# Patient Record
Sex: Female | Born: 1995 | Race: White | Hispanic: No | Marital: Married | Smoking: Never smoker
Health system: Southern US, Community
[De-identification: ages and names within clinical notes are randomized; demographics above are authoritative.]

## PROBLEM LIST (undated history)

## (undated) DIAGNOSIS — R55 Syncope and collapse: Principal | ICD-10-CM

## (undated) DIAGNOSIS — K859 Acute pancreatitis without necrosis or infection, unspecified: Secondary | ICD-10-CM

## (undated) DIAGNOSIS — D219 Benign neoplasm of connective and other soft tissue, unspecified: Secondary | ICD-10-CM

## (undated) DIAGNOSIS — Q5128 Other doubling of uterus, other specified: Secondary | ICD-10-CM

## (undated) DIAGNOSIS — E282 Polycystic ovarian syndrome: Secondary | ICD-10-CM

## (undated) DIAGNOSIS — R51 Headache: Secondary | ICD-10-CM

## (undated) HISTORY — DX: Headache: R51

## (undated) HISTORY — PX: WISDOM TOOTH EXTRACTION: SHX21

## (undated) HISTORY — DX: Polycystic ovarian syndrome: E28.2

## (undated) HISTORY — DX: Syncope and collapse: R55

## (undated) HISTORY — DX: Acute pancreatitis without necrosis or infection, unspecified: K85.90

## (undated) HISTORY — DX: Other and unspecified doubling of uterus: Q51.28

## (undated) HISTORY — DX: Benign neoplasm of connective and other soft tissue, unspecified: D21.9

---
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
## 2015-10-11 ENCOUNTER — Ambulatory Visit (INDEPENDENT_AMBULATORY_CARE_PROVIDER_SITE_OTHER): Payer: BLUE CROSS/BLUE SHIELD | Admitting: Internal Medicine

## 2015-10-11 ENCOUNTER — Telehealth: Payer: Self-pay | Admitting: Internal Medicine

## 2015-10-11 ENCOUNTER — Encounter: Payer: Self-pay | Admitting: Internal Medicine

## 2015-10-11 VITALS — BP 98/60 | HR 72 | Temp 98.0°F | Wt 131.8 lb

## 2015-10-11 DIAGNOSIS — R509 Fever, unspecified: Secondary | ICD-10-CM

## 2015-10-11 DIAGNOSIS — R109 Unspecified abdominal pain: Secondary | ICD-10-CM

## 2015-10-11 DIAGNOSIS — R197 Diarrhea, unspecified: Secondary | ICD-10-CM

## 2015-10-11 DIAGNOSIS — R112 Nausea with vomiting, unspecified: Secondary | ICD-10-CM | POA: Diagnosis not present

## 2015-10-11 LAB — CBC
HCT: 42.4 % (ref 36.0–46.0)
HEMOGLOBIN: 14.4 g/dL (ref 12.0–15.0)
MCHC: 34 g/dL (ref 30.0–36.0)
MCV: 80.8 fl (ref 78.0–100.0)
PLATELETS: 215 10*3/uL (ref 150.0–400.0)
RBC: 5.24 Mil/uL — ABNORMAL HIGH (ref 3.87–5.11)
RDW: 14.4 % (ref 11.5–14.6)
WBC: 6.7 10*3/uL (ref 4.5–10.5)

## 2015-10-11 LAB — COMPREHENSIVE METABOLIC PANEL
ALK PHOS: 67 U/L (ref 39–117)
ALT: 17 U/L (ref 0–35)
AST: 19 U/L (ref 0–37)
Albumin: 4.5 g/dL (ref 3.5–5.2)
BILIRUBIN TOTAL: 0.7 mg/dL (ref 0.2–1.2)
BUN: 28 mg/dL — ABNORMAL HIGH (ref 6–23)
CO2: 24 meq/L (ref 19–32)
CREATININE: 1.42 mg/dL — AB (ref 0.40–1.20)
Calcium: 10.2 mg/dL (ref 8.4–10.5)
Chloride: 99 mEq/L (ref 96–112)
GFR: 50.01 mL/min — AB (ref >60.00)
GLUCOSE: 88 mg/dL (ref 70–99)
Potassium: 3.3 mEq/L — ABNORMAL LOW (ref 3.5–5.1)
Sodium: 134 mEq/L — ABNORMAL LOW (ref 135–145)
TOTAL PROTEIN: 8.5 g/dL — AB (ref 6.0–8.3)

## 2015-10-11 MED ORDER — ONDANSETRON HCL 4 MG/2ML IJ SOLN
4.0000 mg | Freq: Once | INTRAMUSCULAR | Status: AC
  Administered 2015-10-11: 4 mg via INTRAMUSCULAR

## 2015-10-11 MED ORDER — ONDANSETRON HCL 4 MG PO TABS: 4.0000 mg | ORAL_TABLET | Freq: Three times a day (TID) | ORAL | 0 refills | Status: DC | PRN

## 2015-10-11 NOTE — Progress Notes (Signed)
Subjective:    Patient ID: Alicia Davis, female    DOB: Jun 10, 1995, 20 y.o.   MRN: [id number]  HPI  Pt presents to the clinic today with a complaint of diarrhea, vomiting, and lower abdominal pain x 3 days.  She reports the diarrhea is watery, and she is having up to 6 bowel movements a day.  She denies bloody stools or mucous in the stools.  She states that she is unable to keep any food down, and even drinking too much water at a time will cause emesis.  She has tried a bland diet for the past few days with no improvement.  She describes the abdominal pain as constant cramping with intermittent sharp pains at a severity of 9/10.  She reports that when she has the sharp abdominal pains she will also have pain on the right side of her head that she states is "like a lightening strike."  She denies photophobia or phonophobia.  She reports fatigue and intermittent dizziness with associated blurred vision.  She admits to fever ranging from 100-103 and has tried Tylenol with some improvement but symptoms return.  She denies urinary urgency, frequency, changes in urine color, or pain with urination.  She has tried [employer] with no relief.  She denies recent sick contacts or new foods, and reports no other family members have these symptoms.  Her LMP was 09/23/15, and she reports irregular periods x5 years.  She states the current symptoms feel different than the cramping she will have with periods, and denies spotting.    Review of Systems   Past Medical History:  Diagnosis Date  . Headache(784.0)   . Syncope 11/2011   neurocardiogenic per cards Aida Puffer at [facility])    Current Outpatient Prescriptions  Medication Sig Dispense Refill  . ondansetron (ZOFRAN) 4 MG tablet Take 1 tablet (4 mg total) by mouth every 8 (eight) hours as needed. 20 tablet 0   No current facility-administered medications for this visit.     No Known Allergies  Family History  Problem Relation Age of Onset  . Coronary  artery disease Maternal Grandmother 58    MI  . Diabetes Maternal Grandmother   . Coronary artery disease Maternal Grandfather 74    CABG  . Stroke Paternal Grandfather   . Cancer Other 41    prostate (great grandfather)  . Hypertension Neg Hx     Social History   Social History  . Marital status: Single    Spouse name: N/A  . Number of children: N/A  . Years of education: N/A   Occupational History  . Not on file.   Social History Main Topics  . Smoking status: Never Smoker  . Smokeless tobacco: Never Used  . Alcohol use No  . Drug use: No  . Sexual activity: Not on file   Other Topics Concern  . 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   Activity: works on farm   Diet: god water, fruits/vegetables daily     Const: Pt reports fever, fatigue, and dizziness.   HEENT: Pt reports blurred vision when dizzy. GI: Pt reports vomiting, diarrhea, and abdominal pain. GU: Denies urinary frequency or urgency, changes in urine color, pain with urination, or vaginal spotting.  No other specific complaints in a complete review of systems (except as listed in HPI  above).       Objective:   Physical Exam  BP 98/60   Pulse 72   Temp 98 F (36.7 C) (Oral)   Wt 131 lb 12 oz (59.8 kg)   LMP 09/23/2015 (Exact Date)   SpO2 98%   BMI 24.10 kg/m   General: Ill-appearing, in no acute distress. Neck: No lymphadenopathy noted. Pulm: Clear to auscultation bilaterally.  No wheezes, rales, or rhonchi. CV: Regular rate and rhythm.  No murmurs, rubs, or gallops. GI: Positive bowel sounds all four quadrants.  Tympanic on percussion.  Soft, tender to palpation RUQ and RLQ.  Positive Obturator sign.  Positive Psoas sign.  Negative Rovsing's sign.  Negative rebound tenderness.      Assessment & Plan:   Diarrhea, emesis, abdominal pain, fever:  ?Viral  gastrointestinal infection vs Appendicitis/Colitis Stat CBC, CMP Continue bland diet, increase fluid intake Zofran 4 mg IM today Zofran 4mg  q8hrs as needed for nausea  Will follow-up based on lab results, may need CT of abdomen BAITY, REGINA, NP

## 2015-10-11 NOTE — Telephone Encounter (Signed)
She needs the Zofran if she is not able to eat or drink or nausea is too bad.

## 2015-10-11 NOTE — Patient Instructions (Signed)

## 2015-10-11 NOTE — Telephone Encounter (Signed)
Called pt but no VM set up--will try again later

## 2015-10-11 NOTE — Telephone Encounter (Signed)
Patient called to get her lab results.  Threasa Beards was unavailable.  Patient said she was 40 minutes away and needed to know her results in case she needed to be seen.  I gave patient her lab results. Patient said she hasn't thrown up since she left the office this morning.  Patient drank water and had a couple bites of grilled chicken and she has been able to keep that down.  Patient wants to know if she needs medication.  Patient said if she can't keep the water down, she'll go to urgent care or the er. Please call patient back about medication.

## 2015-10-12 NOTE — Telephone Encounter (Signed)
Patient returned Alicia Davis's call.  Please call patient back at [phone].

## 2016-04-02 ENCOUNTER — Ambulatory Visit (INDEPENDENT_AMBULATORY_CARE_PROVIDER_SITE_OTHER): Payer: BLUE CROSS/BLUE SHIELD | Admitting: Internal Medicine

## 2016-04-02 ENCOUNTER — Encounter (INDEPENDENT_AMBULATORY_CARE_PROVIDER_SITE_OTHER): Payer: Self-pay

## 2016-04-02 ENCOUNTER — Ambulatory Visit: Payer: BLUE CROSS/BLUE SHIELD | Admitting: Internal Medicine

## 2016-04-02 ENCOUNTER — Encounter: Payer: Self-pay | Admitting: Internal Medicine

## 2016-04-02 VITALS — BP 104/68 | HR 75 | Temp 97.7°F | Wt 137.0 lb

## 2016-04-02 DIAGNOSIS — N926 Irregular menstruation, unspecified: Secondary | ICD-10-CM

## 2016-04-02 MED ORDER — NORGESTIM-ETH ESTRAD TRIPHASIC 0.18/0.215/0.25 MG-35 MCG PO TABS: 1.0000 | ORAL_TABLET | Freq: Every day | ORAL | 11 refills | Status: DC

## 2016-04-02 NOTE — Patient Instructions (Signed)
Menorrhagia Menorrhagia is when your menstrual periods are heavy or last longer than usual. Follow these instructions at home:  Only take medicine as told by your doctor.  Take any iron pills as told by your doctor. Heavy bleeding may cause low levels of iron in your body.  Do not take aspirin 1 week before or during your period. Aspirin can make the bleeding worse.  Lie down for a while if you change your tampon or pad more than once in 2 hours. This may help lessen the bleeding.  Eat a healthy diet and foods with iron. These foods include leafy green vegetables, meat, liver, eggs, and whole grain breads and cereals.  Do not try to lose weight. Wait until the heavy bleeding has stopped and your iron level is normal. Contact a doctor if:  You soak through a pad or tampon every 1 or 2 hours, and this happens every time you have a period.  You need to use pads and tampons at the same time because you are bleeding so much.  You need to change your pad or tampon during the night.  You have a period that lasts for more than 8 days.  You pass clots bigger than 1 inch (2.5 cm) wide.  You have irregular periods that happen more or less often than once a month.  You feel dizzy or pass out (faint).  You feel very weak or tired.  You feel short of breath or feel your heart is beating too fast when you exercise.  You feel sick to your stomach (nausea) and you throw up (vomit) while you are taking your medicine.  You have watery poop (diarrhea) while you are taking your medicine.  You have any problems that may be related to the medicine you are taking. Get help right away if:  You soak through 4 or more pads or tampons in 2 hours.  You have any bleeding while you are pregnant. This information is not intended to replace advice given to you by your health care provider. Make sure you discuss any questions you have with your health care provider. Document Released: 12/04/2007 Document  Revised: 08/02/2015 Document Reviewed: 08/26/2012 Elsevier Interactive Patient Education  2017 Elsevier Inc.  

## 2016-04-02 NOTE — Progress Notes (Signed)
Subjective:    Patient ID: Alicia Davis, female    DOB: [birth year], 21 y.o.   MRN: [id number]  HPI  Pt presents to the clinic today with c/o irregular menstrual cycle. She reports this started 5 years ago. Her menstrual will last 4-14 days when it does come on. It can occur 2 x a month or she can not have a period for 4 months at a time. She denies heavy bleeding, clots or cramping. She is sexually active. She has taken multiple pregnancy test which are negative. She reports she has a family history of PCOS.  Review of Systems      Past Medical History:  Diagnosis Date  . Headache(784.0)   . Syncope 11/2011   neurocardiogenic per cards Aida Puffer at [facility])    Current Outpatient Prescriptions  Medication Sig Dispense Refill  . Norgestimate-Ethinyl Estradiol Triphasic 0.18/0.215/0.25 MG-35 MCG tablet Take 1 tablet by mouth daily. 1 Package 11   No current facility-administered medications for this visit.     No Known Allergies  Family History  Problem Relation Age of Onset  . Coronary artery disease Maternal Grandmother 58    MI  . Diabetes Maternal Grandmother   . Coronary artery disease Maternal Grandfather 74    CABG  . Stroke Paternal Grandfather   . Cancer Other 68    prostate (great grandfather)  . Hypertension Neg Hx     Social History   Social History  . Marital status: Single    Spouse name: N/A  . Number of children: N/A  . Years of education: N/A   Occupational History  . Not on file.   Social History Main Topics  . Smoking status: Never Smoker  . Smokeless tobacco: Never Used  . Alcohol use No  . Drug use: No  . Sexual activity: Not on file   Other Topics Concern  . 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   Activity: works on farm   Diet: god water, fruits/vegetables daily     Constitutional: Denies fever, malaise,  fatigue, headache or abrupt weight changes.  Gastrointestinal: Denies abdominal pain, bloating, constipation, diarrhea or blood in the stool.  GU: Pt reports irregular periods. Denies urgency, frequency, pain with urination, burning sensation, blood in urine, odor or discharge.  No other specific complaints in a complete review of systems (except as listed in HPI above).  Objective:   Physical Exam   BP 104/68   Pulse 75   Temp 97.7 F (36.5 C) (Oral)   Wt 137 lb (62.1 kg)   LMP 01/01/2016   SpO2 99%   BMI 25.06 kg/m  Wt Readings from Last 3 Encounters:  04/02/16 137 lb (62.1 kg)  10/11/15 131 lb 12 oz (59.8 kg)  11/27/14 141 lb 12 oz (64.3 kg) (73 %, Z= 0.60)*   * Growth percentiles are based on CDC 2-20 Years data.    General: Appears her stated age, well developed, well nourished in NAD. Abdomen: Soft and nontender. Normal bowel sounds.    BMET    Component Value Date/Time   NA 134 (L) 10/11/2015 1126   K 3.3 (L) 10/11/2015 1126   CL 99 10/11/2015 1126   CO2 24 10/11/2015 1126   GLUCOSE 88 10/11/2015 1126   BUN 28 (H) 10/11/2015 1126   CREATININE 1.42 (H) 10/11/2015 1126  CALCIUM 10.2 10/11/2015 1126    Lipid Panel  No results found for: CHOL, TRIG, HDL, CHOLHDL, VLDL, LDLCALC  CBC    Component Value Date/Time   WBC 6.7 10/11/2015 1126   RBC 5.24 (H) 10/11/2015 1126   HGB 14.4 10/11/2015 1126   HCT 42.4 10/11/2015 1126   PLT 215.0 10/11/2015 1126   MCV 80.8 10/11/2015 1126   MCHC 34.0 10/11/2015 1126   RDW 14.4 10/11/2015 1126   LYMPHSABS 1.3 10/29/2011 0946   MONOABS 0.3 10/29/2011 0946   EOSABS 0.1 10/29/2011 0946   BASOSABS 0.0 10/29/2011 0946    Hgb A1C No results found for: HGBA1C         Assessment & Plan:   Irregular menses:  US pelvis/transvaginal ordered- see Rosaria Ferries on the way out to schedule Will start Tri-Sprintec, start on Monday Discussed pregnancy possibility in the first 2 weeks and while on abx  Will follow up after  labs Webb Silversmith, NP

## 2016-04-04 ENCOUNTER — Ambulatory Visit
Admission: RE | Admit: 2016-04-04 | Discharge: 2016-04-04 | Disposition: A | Discharge status: Home / Self Care | Payer: BLUE CROSS/BLUE SHIELD | Source: Ambulatory Visit | Attending: Internal Medicine | Admitting: Internal Medicine

## 2016-04-04 DIAGNOSIS — N926 Irregular menstruation, unspecified: Secondary | ICD-10-CM | POA: Insufficient documentation

## 2017-02-03 ENCOUNTER — Ambulatory Visit (INDEPENDENT_AMBULATORY_CARE_PROVIDER_SITE_OTHER): Payer: BLUE CROSS/BLUE SHIELD | Admitting: Internal Medicine

## 2017-02-03 ENCOUNTER — Encounter: Payer: Self-pay | Admitting: Internal Medicine

## 2017-02-03 VITALS — BP 104/68 | HR 69 | Temp 98.0°F | Wt 143.0 lb

## 2017-02-03 DIAGNOSIS — J302 Other seasonal allergic rhinitis: Secondary | ICD-10-CM

## 2017-02-03 NOTE — Progress Notes (Signed)
HPI  Pt presents to the clinic today with c/o headache and nasal congestion. This started 1 week ago. The headache is located above her right eye. She describes the headache as pressure. She denies visual changes or dizziness. She is blowing clear mucous out of her nose. She feels like she is running a fever, because she has had chills, but she denies body aches. She has tried Benadryl, Tylenol, Dayquil and Nyquil with good relief. She has not had sick contacts that she is aware of.  Review of Systems     Past Medical History:  Diagnosis Date  . Headache(784.0)   . Syncope 11/2011   neurocardiogenic per cards Aida Puffer at [facility])    Family History  Problem Relation Age of Onset  . Coronary artery disease Maternal Grandmother 58       MI  . Diabetes Maternal Grandmother   . Coronary artery disease Maternal Grandfather 74       CABG  . Stroke Paternal Grandfather   . Cancer Other 49       prostate (great grandfather)  . Hypertension Neg Hx     Social History   Socioeconomic History  . Marital status: Single    Spouse name: Not on file  . Number of children: Not on file  . Years of education: Not on file  . Highest education level: Not on file  Social Needs  . Financial resource strain: Not on file  . Food insecurity - worry: Not on file  . Food insecurity - inability: Not on file  . Transportation needs - medical: Not on file  . Transportation needs - non-medical: Not on file  Occupational History  . Not on file  Tobacco Use  . Smoking status: Never Smoker  . Smokeless tobacco: Never Used  Substance and Sexual Activity  . Alcohol use: No  . Drug use: No  . Sexual activity: Not on file  Other Topics Concern  . 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   Activity: works on farm   Diet: god water, fruits/vegetables daily    No Known  Allergies   Constitutional: Positive headache. Denies fatigue, fever or abrupt weight changes.  HEENT:  Positive nasal congestion. Denies eye redness, ear pain, ringing in the ears, wax buildup, runny nose or sore throat. Respiratory:  Denies cough, difficulty breathing or shortness of breath.  Cardiovascular: Denies chest pain, chest tightness, palpitations or swelling in the hands or feet.   No other specific complaints in a complete review of systems (except as listed in HPI above).  Objective:   BP 104/68   Pulse 69   Temp 98 F (36.7 C) (Oral)   Wt 143 lb (64.9 kg)   LMP 01/12/2017   SpO2 99%   BMI 26.16 kg/m   General: Appears her stated age, in NAD. HEENT: Head: normal shape and size, no sinus tenderness noted; Ears: Tm's gray and intact, normal light reflex; Nose: mucosa boggy and moist, turbinates swollen; Throat/Mouth: + PND. Teeth present, mucosa erythematous and moist, no exudate noted, no lesions or ulcerations noted.  Neck:  Anterior cervical adenopathy noted on the right.  Pulmonary/Chest: Normal effort and positive vesicular breath sounds. No respiratory distress. No wheezes, rales or ronchi noted.       Assessment & Plan:   Allergic Rhintis  Can use a Neti  Pot which can be purchased from your local drug store. Flonase 2 sprays each nostril for 3 days and then as needed. Continue Dayquil and Nyquil as needed  RTC as needed or if symptoms persist. Webb Silversmith, NP

## 2017-02-03 NOTE — Patient Instructions (Signed)
Allergic Rhinitis Allergic rhinitis is when the mucous membranes in the nose respond to allergens. Allergens are particles in the air that cause your body to have an allergic reaction. This causes you to release allergic antibodies. Through a chain of events, these eventually cause you to release histamine into the blood stream. Although meant to protect the body, it is this release of histamine that causes your discomfort, such as frequent sneezing, congestion, and an itchy, runny nose. What are the causes? Seasonal allergic rhinitis (hay fever) is caused by pollen allergens that may come from grasses, trees, and weeds. Year-round allergic rhinitis (perennial allergic rhinitis) is caused by allergens such as house dust mites, pet dander, and mold spores. What are the signs or symptoms?  Nasal stuffiness (congestion).  Itchy, runny nose with sneezing and tearing of the eyes. How is this diagnosed? Your health care provider can help you determine the allergen or allergens that trigger your symptoms. If you and your health care provider are unable to determine the allergen, skin or blood testing may be used. Your health care provider will diagnose your condition after taking your health history and performing a physical exam. Your health care provider may assess you for other related conditions, such as asthma, pink eye, or an ear infection. How is this treated? Allergic rhinitis does not have a cure, but it can be controlled by:  Medicines that block allergy symptoms. These may include allergy shots, nasal sprays, and oral antihistamines.  Avoiding the allergen. Hay fever may often be treated with antihistamines in pill or nasal spray forms. Antihistamines block the effects of histamine. There are over-the-counter medicines that may help with nasal congestion and swelling around the eyes. Check with your health care provider before taking or giving this medicine. If avoiding the allergen or the  medicine prescribed do not work, there are many new medicines your health care provider can prescribe. Stronger medicine may be used if initial measures are ineffective. Desensitizing injections can be used if medicine and avoidance does not work. Desensitization is when a patient is given ongoing shots until the body becomes less sensitive to the allergen. Make sure you follow up with your health care provider if problems continue. Follow these instructions at home: It is not possible to completely avoid allergens, but you can reduce your symptoms by taking steps to limit your exposure to them. It helps to know exactly what you are allergic to so that you can avoid your specific triggers. Contact a health care provider if:  You have a fever.  You develop a cough that does not stop easily (persistent).  You have shortness of breath.  You start wheezing.  Symptoms interfere with normal daily activities. This information is not intended to replace advice given to you by your health care provider. Make sure you discuss any questions you have with your health care provider. Document Released: 11/19/2000 Document Revised: 10/26/2015 Document Reviewed: 11/01/2012 Elsevier Interactive Patient Education  2017 [employer]  

## 2017-02-16 ENCOUNTER — Other Ambulatory Visit: Payer: BLUE CROSS/BLUE SHIELD

## 2017-02-20 ENCOUNTER — Encounter: Payer: BLUE CROSS/BLUE SHIELD | Admitting: Family Medicine

## 2017-05-13 IMAGING — US US TRANSVAGINAL NON-OB
1 series · 14 of 25 positions shown · non-contrast
Comparison: None

CLINICAL DATA: Irregular menses



[Series 1: us transvaginal non-ob · 0.21mm/px · 14 of 93 slices shown]
[im 1/93]
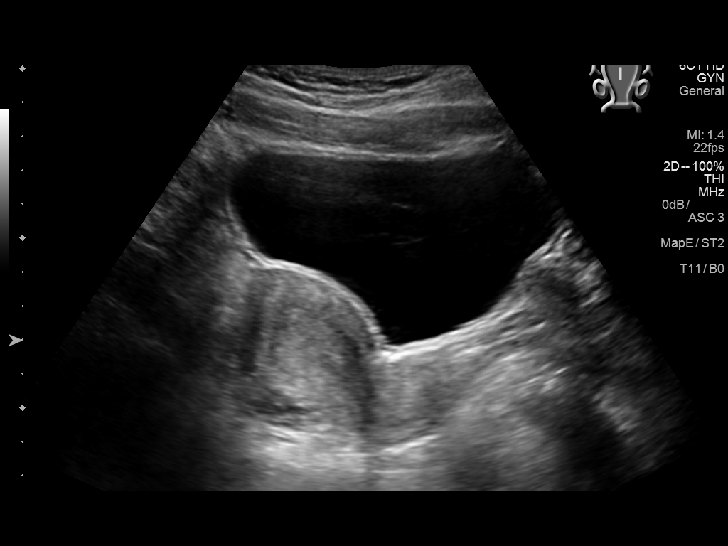
[im 8/93]
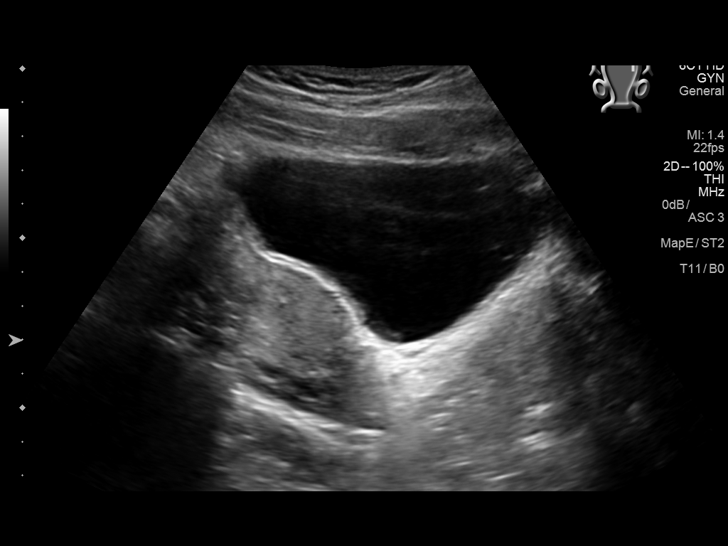
[im 16/93]
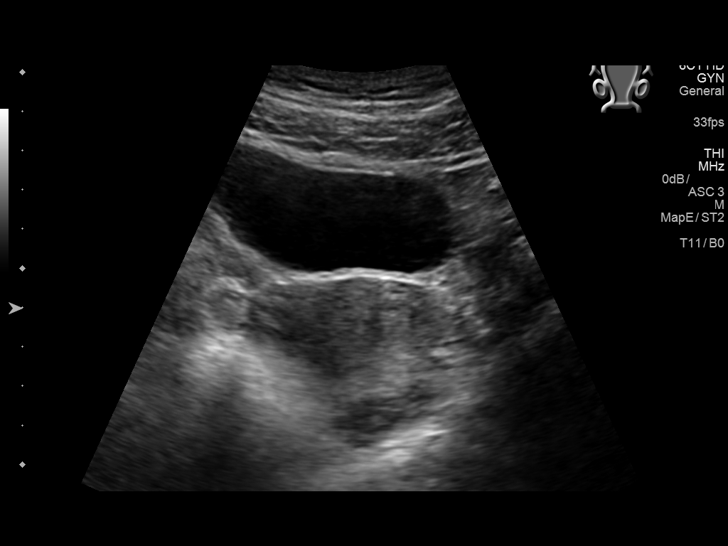
[im 24/93]
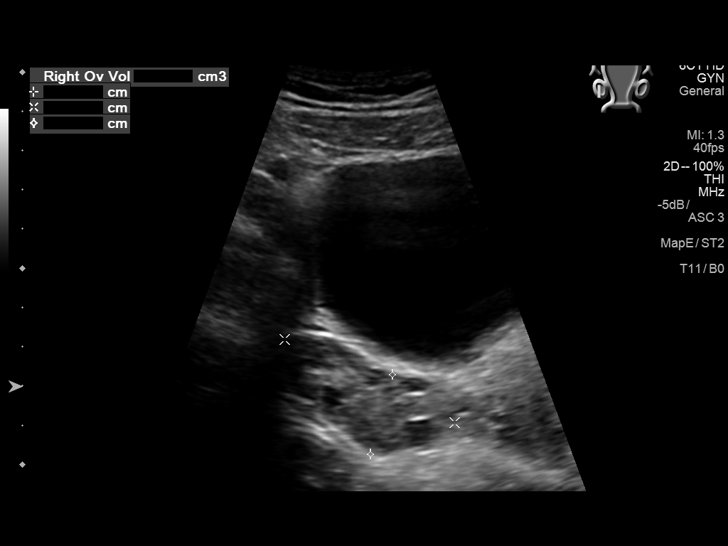
[im 31/93]
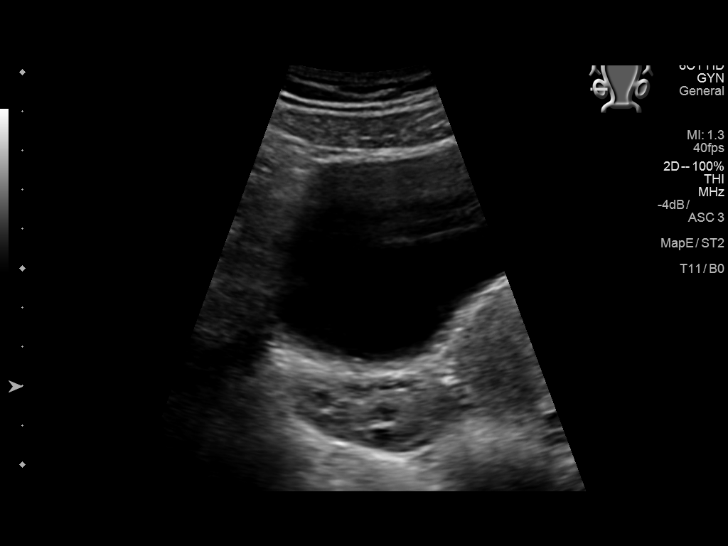
[im 35/93]
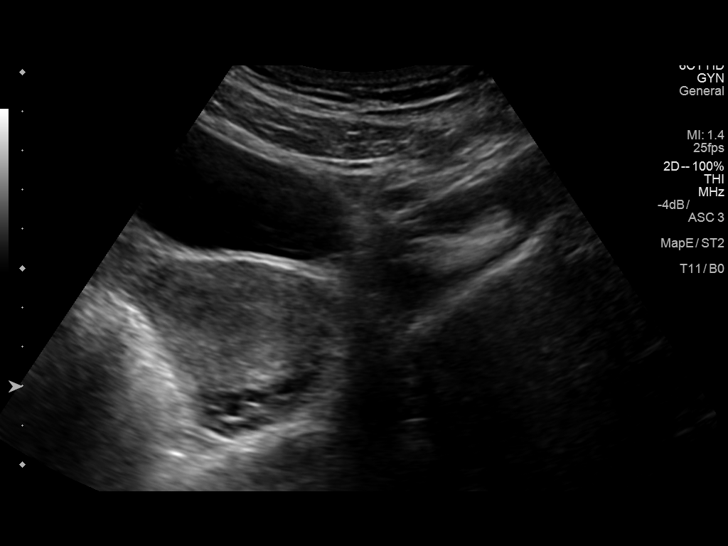
[im 43/93]
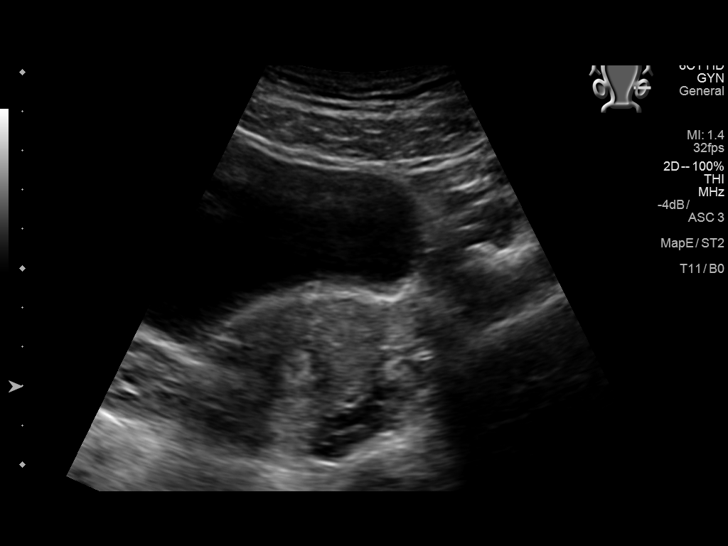
[im 50/93]
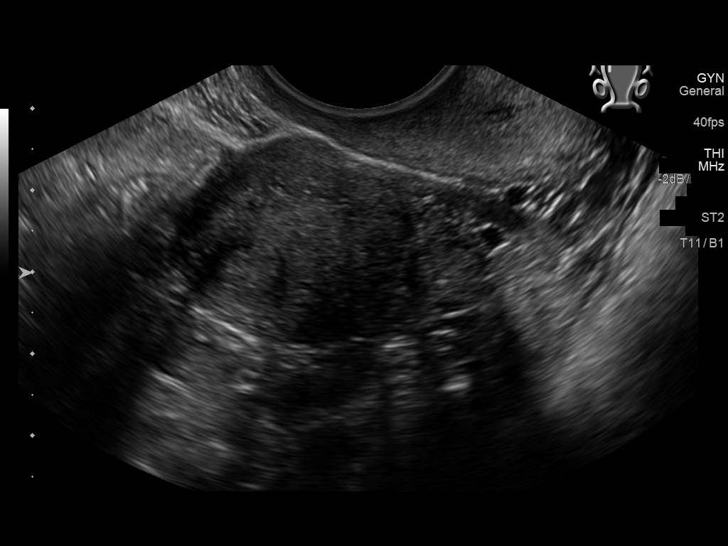
[im 58/93]
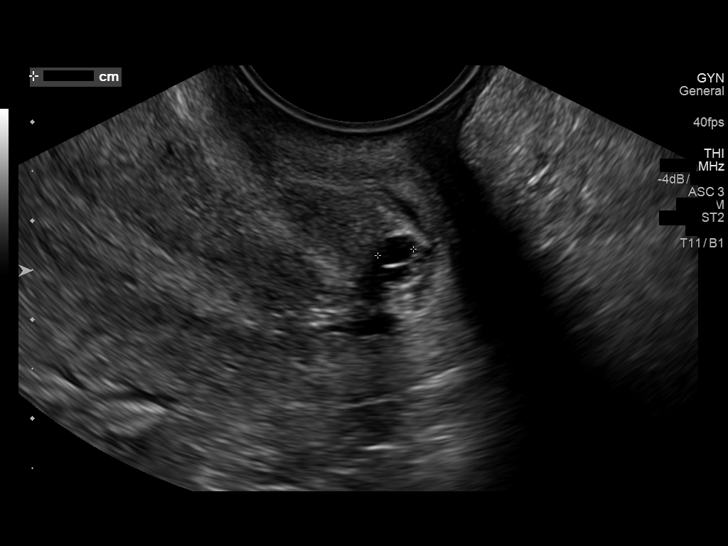
[im 62/93]
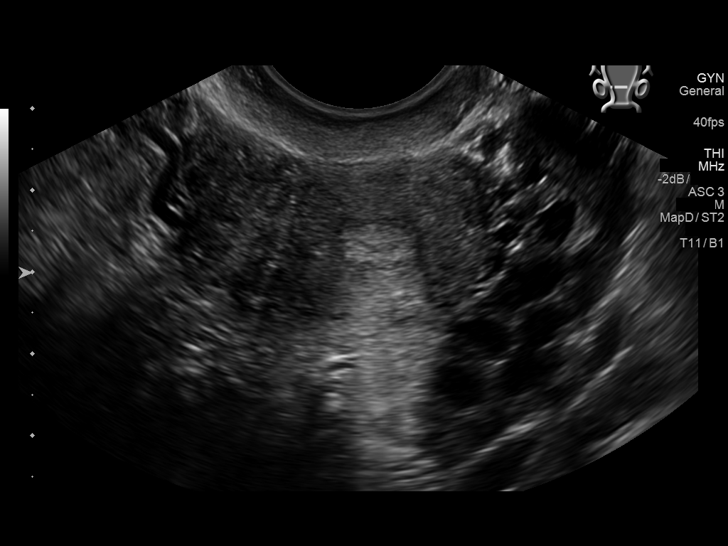
[im 70/93]
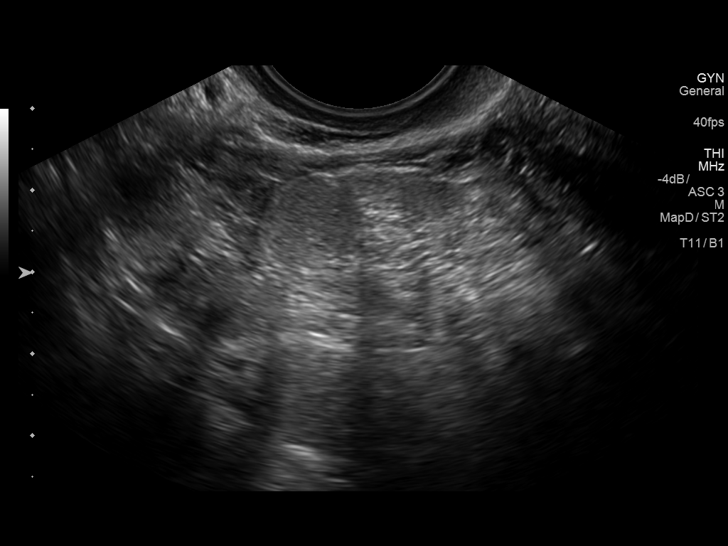
[im 77/93]
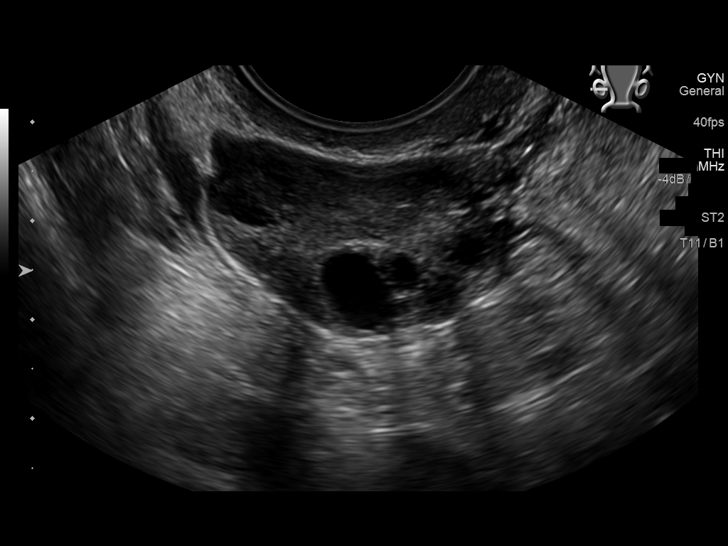
[im 85/93]
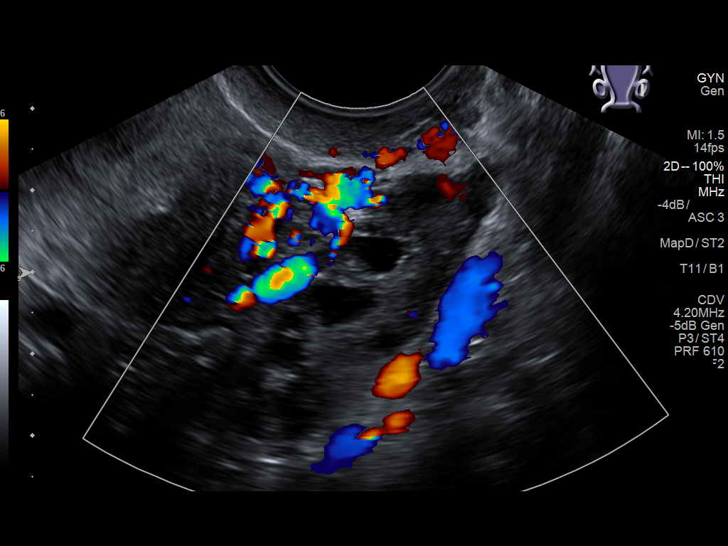
[im 93/93]
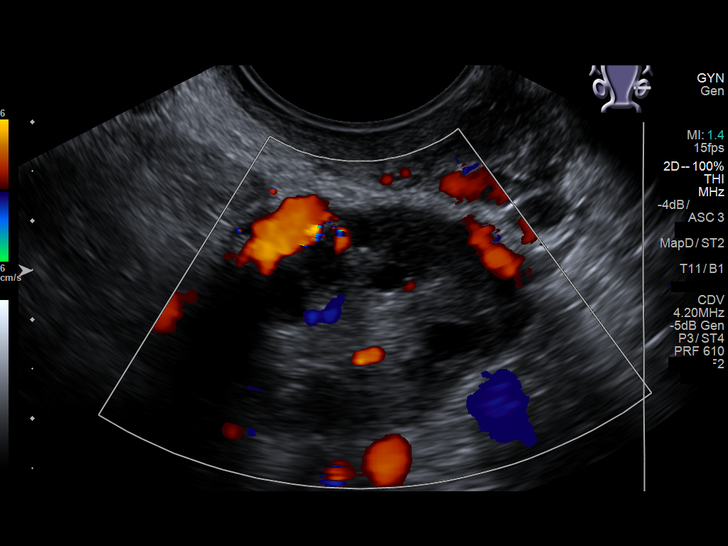

[14 of 25 positions shown; findings below may reference images not displayed]

FINDINGS: Uterus

Measurements: 7.2 x 3.4 x 5.2 cm. Multiple small nabothian cysts. No
fibroids or other mass visualized.

Endometrium

Thickness: 6 mm in thickness.  No focal abnormality visualized.

Right ovary

Measurements: 4.9 x 1.8 x 2.8 cm. Multiple small follicles

Left ovary

Measurements: 4.2 x 1.8 x 2.5 cm. Multiple small follicles

Other findings

No abnormal free fluid.
IMPRESSION: No acute findings. Multiple small follicles in the ovaries
bilaterally. This can be seen with polycystic ovarian syndrome.
Recommend clinical correlation.

## 2018-02-09 ENCOUNTER — Other Ambulatory Visit: Payer: Self-pay | Admitting: Obstetrics & Gynecology

## 2018-02-09 DIAGNOSIS — N6310 Unspecified lump in the right breast, unspecified quadrant: Secondary | ICD-10-CM

## 2018-02-17 ENCOUNTER — Ambulatory Visit
Admission: RE | Admit: 2018-02-17 | Discharge: 2018-02-17 | Disposition: A | Discharge status: Home / Self Care | Payer: BLUE CROSS/BLUE SHIELD | Source: Ambulatory Visit | Attending: Obstetrics & Gynecology | Admitting: Obstetrics & Gynecology

## 2018-02-17 DIAGNOSIS — N6310 Unspecified lump in the right breast, unspecified quadrant: Secondary | ICD-10-CM | POA: Diagnosis not present

## 2021-06-26 ENCOUNTER — Ambulatory Visit (INDEPENDENT_AMBULATORY_CARE_PROVIDER_SITE_OTHER): Payer: Self-pay | Admitting: Obstetrics & Gynecology

## 2021-06-26 ENCOUNTER — Encounter: Payer: Self-pay | Admitting: Obstetrics & Gynecology

## 2021-06-26 ENCOUNTER — Other Ambulatory Visit (HOSPITAL_COMMUNITY)
Admission: RE | Admit: 2021-06-26 | Discharge: 2021-06-26 | Disposition: A | Discharge status: Home / Self Care | Payer: Self-pay | Source: Ambulatory Visit | Attending: Obstetrics & Gynecology | Admitting: Obstetrics & Gynecology

## 2021-06-26 VITALS — BP 118/78 | HR 73 | Ht 61.0 in | Wt 155.0 lb

## 2021-06-26 DIAGNOSIS — N970 Female infertility associated with anovulation: Secondary | ICD-10-CM

## 2021-06-26 DIAGNOSIS — Z124 Encounter for screening for malignant neoplasm of cervix: Secondary | ICD-10-CM

## 2021-06-26 DIAGNOSIS — N914 Secondary oligomenorrhea: Secondary | ICD-10-CM

## 2021-06-26 DIAGNOSIS — Q5122 Other partial doubling of uterus: Secondary | ICD-10-CM

## 2021-06-26 DIAGNOSIS — E282 Polycystic ovarian syndrome: Secondary | ICD-10-CM

## 2021-06-26 NOTE — Progress Notes (Signed)
? ?GYN VISIT ?Patient name: Alicia Davis MRN [id number]  Date of birth: 01/20/1996 ?Chief Complaint:   ?discuss PCOS and fibroids ? ?History of Present Illness:   ?Alicia Davis is a 26 y.o. G0P0000 female being seen today for the following concerns: ? ?-Infertility: Diagnosed with PCOS and struggles with irregular period.  Started period @ 26yo- menses always irregular- typically has period every couple of mos.  Back in high school have heavy bleeding x 2-4 weeks then go 2-26mo without a period. On OCPs, menses were regular each month.  Then stopped menses Jan 2020 and since then her periods have been irregular.  Last several months as followed: ?Apr 13-19, Mar/Feb no menses then Jan 3 ?[facility] but didn't feel like they were helping her.Per patient, she was treated with Letrozole for over a year.  She did have some monthly cycles, but no pregnancy. ? 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  No uterine fibroids.  Both ovaries with numerous follicles. ? ?At this point, she states she is just "sick of bandaids" and either wants to be pregnant and "wants it out." ? ?Denies other medical problems.  Denies h/o STI.  Husband has not fathered a child.  No other acute complaints ? ?Patient's last menstrual period was 06/20/2021. ? ?   ? View : No data to display.  ?  ?  ?  ? ? ? ?Review of Systems:   ?Pertinent items are noted in HPI ?Denies fever/chills, dizziness, headaches, visual disturbances, fatigue, shortness of breath, chest pain, abdominal pain, vomiting, bowel movements, urination, or intercourse unless otherwise stated above.  ?Pertinent History Reviewed:  ?Reviewed past medical,surgical, social, obstetrical and family history.  ?Reviewed problem list, medications and allergies. ?Physical Assessment:  ? ?Vitals:  ? 06/26/21 1425  ?BP: 118/78  ?Pulse: 73  ?Weight: 155 lb (70.3 kg)  ?Height: '5\' 1"'$  (1.549 m)   ?Body mass index is 29.29 kg/m?. ? ?     Physical Examination:  ? General appearance: alert, well appearing, and in no distress ? Psych: mood appropriate, normal affect ? Skin: warm & dry  ? Cardiovascular: normal heart rate noted ? Respiratory: normal respiratory effort, no distress ? Abdomen: soft, non-tender, no rebound, no guarding  ? Pelvic: VULVA: normal appearing vulva with no masses, tenderness or lesions, VAGINA: normal appearing vagina with normal color and discharge, no lesions, CERVIX: normal appearing cervix without discharge or lesions, UTERUS: uterus is normal size, shape, consistency and nontender, ADNEXA: normal adnexa in size, nontender and no masses ? Extremities: no edema  ? ?Chaperone: JLevy Pupa  ? ?Assessment & Plan:  ?1) Cerivcal cancer screening ?-pap collected, reviewed guidelines ? ?2) Infertility, PCOS, oligomenorrhea ?-records reviewed- last [place] 06[place].  .Marland Kitchen Discussed with patients finding including confirmation PCOS and also midline septum ?-She reported that lab work was also completed; however, did not seem to find this in the record.  From her report it sounds [facility]- however [facility] for [facility] not found in [place]?-discussed that unfortunately PCOS with irregular periods is a common cause of infertility and that often people need assistance from an REI specialist ?-discussed that process can be both physically and emotionally draining ?-[facility]- briefly discussed with patient that she will likely need further ART and possibly IVF.  Additionally, they may recommend resection of septum/further evaluation ?-  suspect there may be a long wait for REI and advised pt to consider OCPs in the interim to help regulate her PCOS and associated symptoms- she declined at this time ? ? ?Janyth Pupa, DO ?Attending [facility] ? ? ? ?

## 2021-06-28 LAB — CYTOLOGY - PAP
Adequacy: ABSENT
Chlamydia: NEGATIVE
Comment: NEGATIVE
Comment: NEGATIVE
Comment: NORMAL
Diagnosis: NEGATIVE
High risk HPV: NEGATIVE
Neisseria Gonorrhea: NEGATIVE
